# Patient Record
Sex: Male | Born: 2000 | Hispanic: Yes | Marital: Single | Smoking: Never smoker
Health system: Southern US, Community
[De-identification: ages and names within clinical notes are randomized; demographics above are authoritative.]

---
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
## 2022-09-07 IMAGING — DX DG HAND COMPLETE 3+V*L*
3 series · 3 of 3 positions shown · non-contrast
Comparison: Left wrist 12/01/2014

CLINICAL DATA: Hand laceration to the distal index digit.

EXAM:
LEFT HAND - COMPLETE 3+ VIEW

[hand ap]
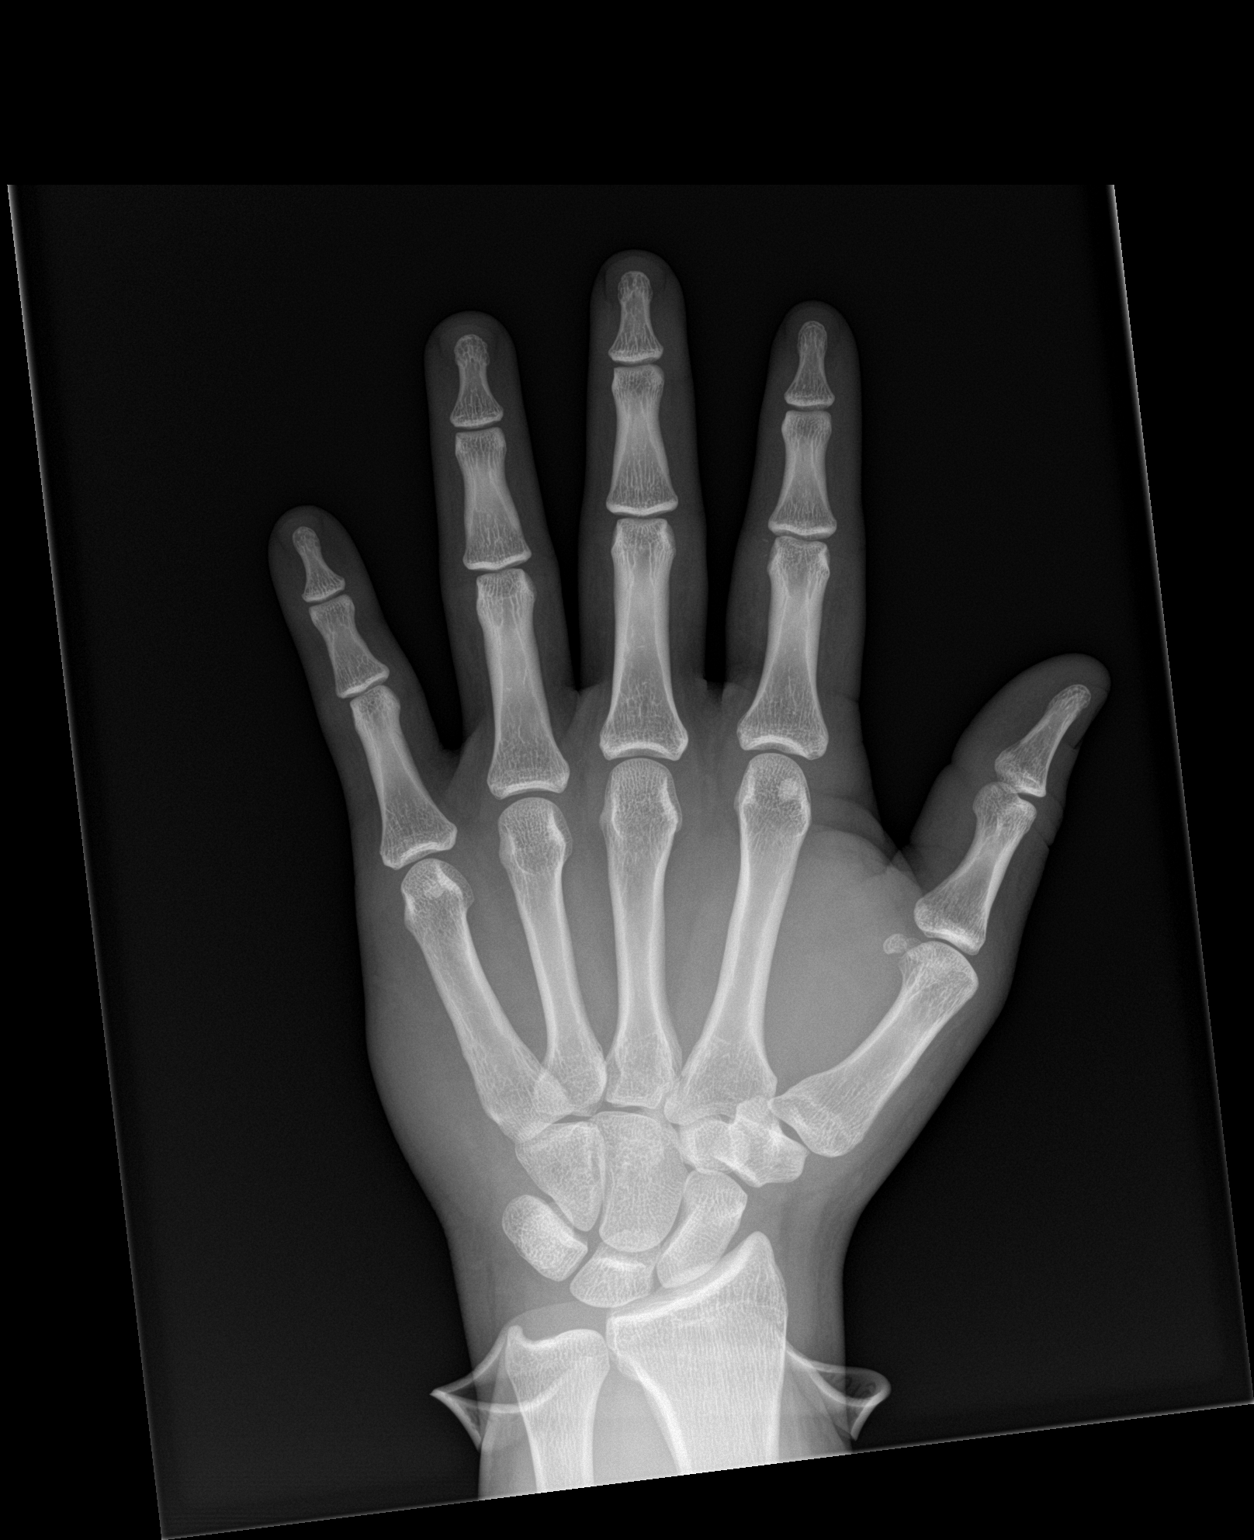

[hand obl]
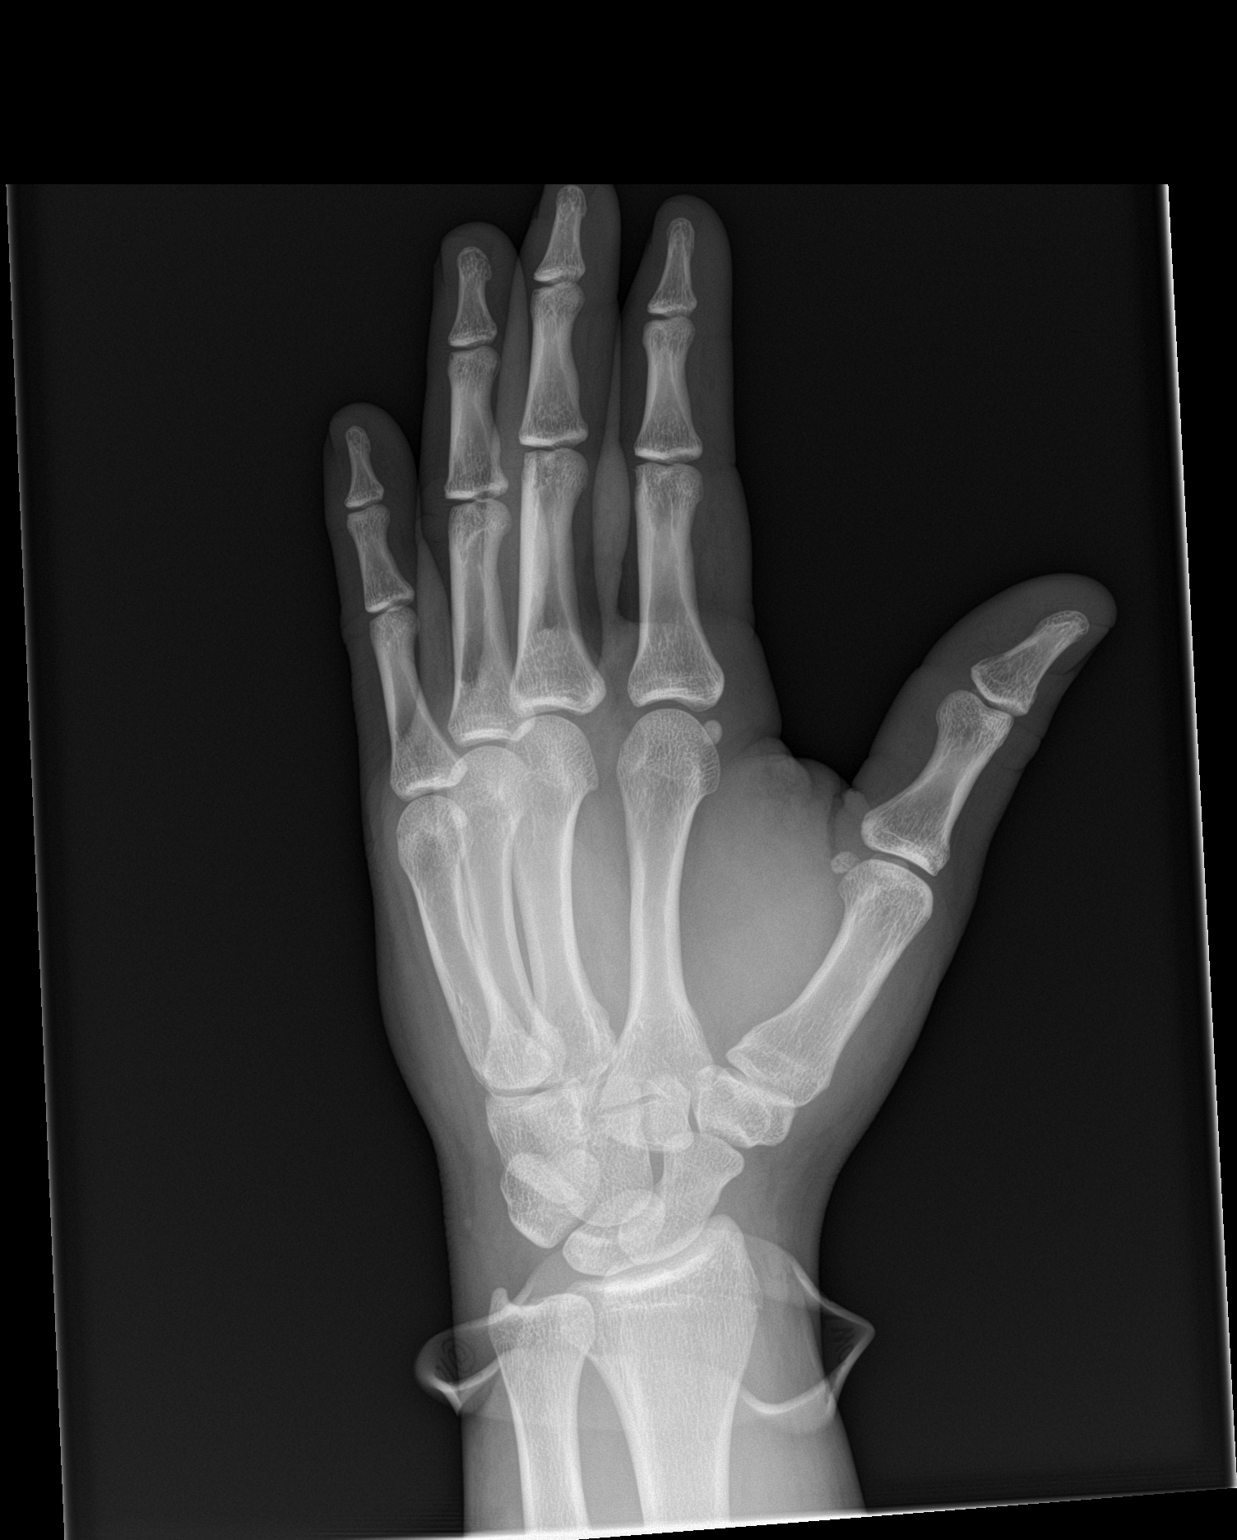

[hand lat]
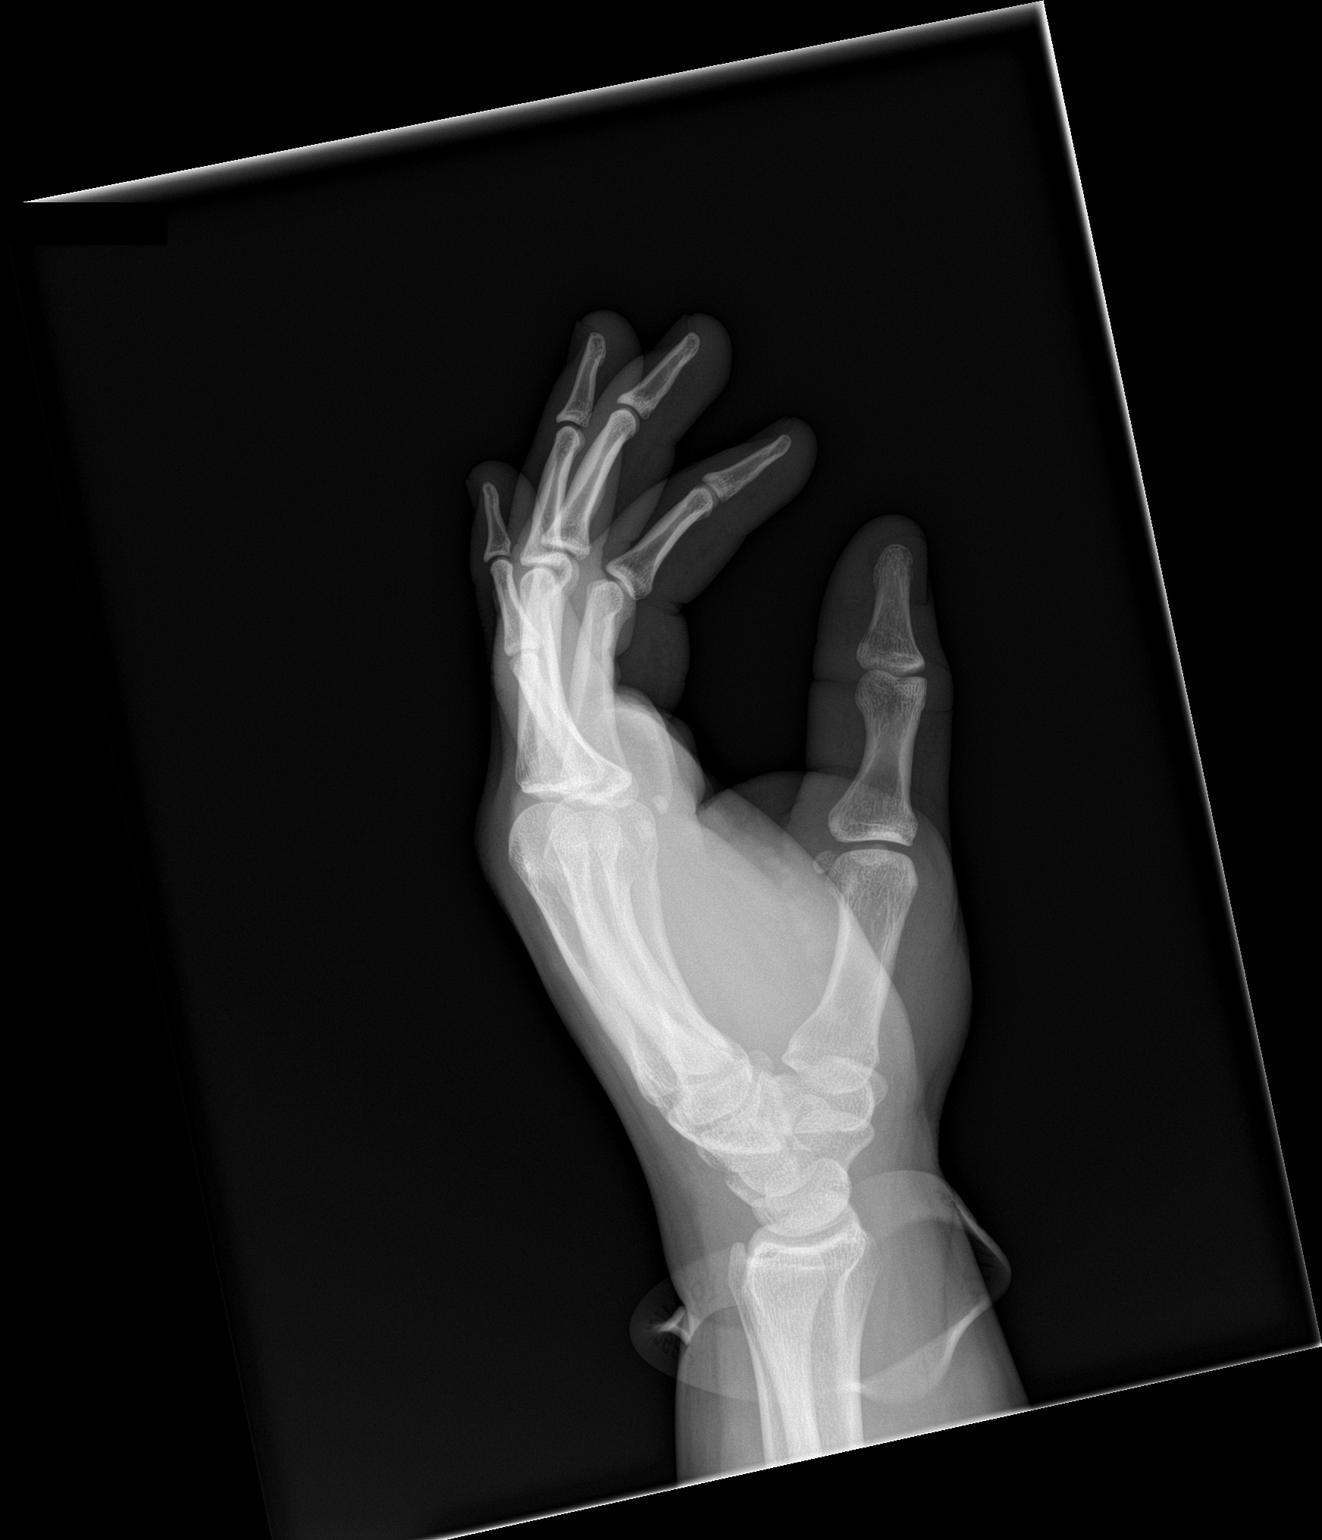

[3 of 3 positions shown; findings below may reference images not displayed]

FINDINGS: There is no evidence of fracture or dislocation. There is no
evidence of arthropathy or other focal bone abnormality. Soft
tissues are unremarkable. No soft tissue gas identified.
IMPRESSION: Negative.

## 2023-02-01 ENCOUNTER — Ambulatory Visit
Admission: EM | Admit: 2023-02-01 | Discharge: 2023-02-01 | Disposition: A | Discharge status: Home / Self Care | Payer: Medicaid Other | Attending: Family Medicine | Admitting: Family Medicine

## 2023-02-01 DIAGNOSIS — T675XXA Heat exhaustion, unspecified, initial encounter: Secondary | ICD-10-CM

## 2023-02-01 NOTE — ED Provider Notes (Signed)
[facility]    CSN: [id number] Arrival date & time: 02/01/23  1347      History   Chief Complaint Chief Complaint  Patient presents with   Dizziness   Fatigue    HPI Jerrelle Nabor is a 22 y.o. male.   HPI   Joby presents for dizziness. He was in the front yard doing some yard work then started having a headache and felt like he was going to pass out. He drank some water as he noticed that he hadn't drank any all day. Dizziness and headache have resolved but he still feels tired.  No nausea, vomiting, chest pain, palpitations, abdominal pain, confusion.    History reviewed. No pertinent past medical history.  There are no problems to display for this patient.   History reviewed. No pertinent surgical history.     Home Medications    Prior to Admission medications   Not on File    Family History History reviewed. No pertinent family history.  Social History Social History   Tobacco Use   Smoking status: Never   Smokeless tobacco: Never  Vaping Use   Vaping Use: Never used  Substance Use Topics   Alcohol use: Never   Drug use: Yes    Types: Marijuana     Allergies   Patient has no known allergies.   Review of Systems Review of Systems: negative unless otherwise stated in HPI.      Physical Exam Triage Vital Signs ED Triage Vitals  Enc Vitals Group     BP 02/01/23 1417 137/89     Pulse Rate 02/01/23 1417 76     Resp 02/01/23 1417 16     Temp 02/01/23 1417 98.7 F (37.1 C)     Temp Source 02/01/23 1417 Oral     SpO2 02/01/23 1417 98 %     Weight --      Height 02/01/23 1417 5\' 7"  (1.702 m)     Head Circumference --      Peak Flow --      Pain Score 02/01/23 1420 0     Pain Loc --      Pain Edu? --      Excl. in GC? --    No data found.  Updated Vital Signs BP 137/89 (BP Location: Left Arm)   Pulse 76   Temp 98.7 F (37.1 C) (Oral)   Resp 16   Ht 5\' 7"  (1.702 m)   SpO2 98%   BMI 36.02 kg/m   Visual  Acuity Right Eye Distance:   Left Eye Distance:   Bilateral Distance:    Right Eye Near:   Left Eye Near:    Bilateral Near:     Physical Exam GEN:     alert, well-appearing male and no distress    HENT:  mucus membranes moist, oropharyngeal without lesions or erythema ,  nares patent, no nasal discharge  EYES:   pupils equal and reactive, EOM intact NECK:  supple, normal ROM, no lymphadenopathy  RESP:  clear to auscultation bilaterally, no increased work of breathing  CVS:   regular rate and rhythm, no murmur, distal pulses intact    EXT:   normal ROM, atraumatic, no edema  NEURO:  alert, oriented, speech normal, CN 2-12 grossly intact, no facial droop,  sensation grossly intact, strength 5/5 bilateral UE and LE, normal coordination, normal finger to nose, Negative Romberg , normal gait Skin:   warm and dry, no rash  UC Treatments / Results  Labs (all labs ordered are listed, but only abnormal results are displayed) Labs Reviewed - No data to display  EKG  If EKG performed, see my interpretation in the MDM section  Radiology No results found.   Procedures Procedures (including critical care time)  Medications Ordered in UC Medications - No data to display  Initial Impression / Assessment and Plan / UC Course  I have reviewed the triage vital signs and the nursing notes.  Pertinent labs & imaging results that were available during my care of the patient were reviewed by me and considered in my medical decision making (see chart for details).       Patient is a 22 y.o. male  who presents for dizziness and fatigue after working in the hot sun.  Overall patient is well appearing and afebrile.  Vital signs stable.  Cardiopulmonary and neurological exams are unremarkable.  He has been hydrating since being in the urgent care and symptoms are improving.  I suspect patient has heat related exhaustion.  He does not have symptoms of acute stroke at this time.  Heat  exhaustion handout provided.  Advised patient to monitor his hydration status for dehydration.  He is to avoid being in the sun for long periods of time.  He voiced understanding of this information.  Work note provided.  ED and return precautions given and patient/guardian voiced understanding. Discussed MDM, treatment plan and plan for follow-up with patient who agrees with plan.    Final Clinical Impressions(s) / UC Diagnoses   Final diagnoses:  Heat exhaustion, initial encounter     Discharge Instructions      I suspect you had a heat related injury but not a heat stroke. See handout on heat exhaustion. Be sure to monitor your hydration status and notice the color of your urine. Avoid being out in the sun for long periods of time.       ED Prescriptions   None    PDMP not reviewed this encounter.   Katha Cabal, DO 02/07/23 [phone]

## 2023-02-01 NOTE — Discharge Instructions (Addendum)
I suspect you had a heat related injury but not a heat stroke. See handout on heat exhaustion. Be sure to monitor your hydration status and notice the color of your urine. Avoid being out in the sun for long periods of time.

## 2023-02-01 NOTE — ED Triage Notes (Signed)
Pt c/o dizziness & fatigue that started today. States he was doing some yard work & he got very dizzy. Denies any HA or blurry vision.
